# Patient Record
Sex: Female | Born: 1990 | Hispanic: No | Marital: Single | Smoking: Never smoker
Health system: Southern US, Community
[De-identification: ages and names within clinical notes are randomized; demographics above are authoritative.]

## PROBLEM LIST (undated history)

## (undated) DIAGNOSIS — F4310 Post-traumatic stress disorder, unspecified: Secondary | ICD-10-CM

## (undated) DIAGNOSIS — F419 Anxiety disorder, unspecified: Secondary | ICD-10-CM

## (undated) HISTORY — DX: Anxiety disorder, unspecified: F41.9

## (undated) HISTORY — DX: Post-traumatic stress disorder, unspecified: F43.10

---

## 2019-09-21 ENCOUNTER — Ambulatory Visit: Payer: 59 | Admitting: Critical Care Medicine

## 2019-09-21 ENCOUNTER — Other Ambulatory Visit: Payer: Self-pay

## 2019-09-21 VITALS — BP 118/70 | HR 81 | Temp 98.7°F | Resp 18 | Ht 64.0 in | Wt 104.0 lb

## 2019-09-21 DIAGNOSIS — F4310 Post-traumatic stress disorder, unspecified: Secondary | ICD-10-CM | POA: Diagnosis not present

## 2019-09-21 DIAGNOSIS — Z114 Encounter for screening for human immunodeficiency virus [HIV]: Secondary | ICD-10-CM | POA: Diagnosis not present

## 2019-09-21 DIAGNOSIS — F411 Generalized anxiety disorder: Secondary | ICD-10-CM | POA: Insufficient documentation

## 2019-09-21 DIAGNOSIS — E538 Deficiency of other specified B group vitamins: Secondary | ICD-10-CM | POA: Diagnosis not present

## 2019-09-21 NOTE — Assessment & Plan Note (Signed)
Severe anxiety state law declined by the posttraumatic stress disorder as per above assessment plan will be looking for an ambulatory referral to psychology  We will hold off on medications at this time

## 2019-09-21 NOTE — Assessment & Plan Note (Signed)
History of vitamin B-12 deficiency currently currently on supplementation  We will obtain a vitamin B12 level at this time

## 2019-09-21 NOTE — Progress Notes (Signed)
Subjective:    Patient ID: Mallory Arnold, female    DOB: 09/04/1990, 29 y.o.   MRN: [id number]  This is a pleasant 29 year old female who comes to the mobile medicine unit after having arrived from [place] to [place] to Dr. Have months ago.  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  This patient has a history of posttraumatic stress disorder symptom complex and that she saw did bodies on the street where she grew up in [place] in the fourth grade and she now has recurring thoughts that are precipitated when she has a fear of death if she loses a loved one or sees death on television or other media.  This creates increased palpitations chest pain nervousness anxiety she also has crying spells as well.  Her mood [place] is exacerbated the symptoms and that she is trying to adjust to her new environment.  She is yet to achieve a primary care physician is looking for this as well.  She did have a Pap smear a year ago which is another one this year.  From a symptom complex perspective she has no other specific symptoms.  She does take B12 supplementation using a sublingual 5000 mcg administration daily for a month and she will go months without them cycle back on again.  She has been on this medication recently.  She is also on no other medications at this time.  She is due a tetanus vaccine but wishes to hold off on that at this time but is willing to have lab studies done   Past Medical History:  Diagnosis Date  . Anxiety   . PTSD (post-traumatic stress disorder)      Family History  Problem Relation Age of Onset  . Diabetes Mother   . Hypertension Father   . Vision loss Father      Social History   Socioeconomic History  . Marital status: Single    Spouse name: Not on file  . Number of children: Not on file  . Years of education: Not on file  . Highest education level: Not on file  Occupational History  . Not on file  Tobacco Use  . Smoking  status: Never Smoker  . Smokeless tobacco: Never Used  Substance and Sexual Activity  . Alcohol use: Not on file  . Drug use: Not on file  . Sexual activity: Not Currently  Other Topics Concern  . Not on file  Social History Narrative  . Not on file   Social Determinants of Health   Financial Resource Strain:   . Difficulty of Paying Living Expenses:   Food Insecurity:   . Worried About Charity fundraiser in the Last Year:   . Arboriculturist in the Last Year:   Transportation Needs:   . Film/video editor (Medical):   Marland Kitchen Lack of Transportation (Non-Medical):   Physical Activity:   . Days of Exercise per Week:   . Minutes of Exercise per Session:   Stress:   . Feeling of Stress :   Social Connections:   . Frequency of Communication with Friends and Family:   . Frequency of Social Gatherings with Friends and Family:   . Attends Religious Services:   . Active Member of Clubs or Organizations:   . Attends Archivist Meetings:   Marland Kitchen Marital Status:   Intimate Partner Violence:   . Fear of Current or Ex-Partner:   . Emotionally Abused:   .  Physically Abused:   . Sexually Abused:      Allergies  Allergen Reactions  . Beef-Derived Products Hives and Swelling    2008  . Cinnamon Hives    2006     Outpatient Medications Prior to Visit  Medication Sig Dispense Refill  . Cyanocobalamin (VITAMIN B-12) 5000 MCG SUBL Place under the tongue. Take daily for one month, then wait a month and resume for a one month course     No facility-administered medications prior to visit.     Review of Systems  Constitutional: Negative for activity change, appetite change and fatigue.  HENT: Negative.   Eyes: Negative.   Respiratory: Negative.   Cardiovascular: Positive for palpitations. Negative for chest pain and leg swelling.  Gastrointestinal: Negative for abdominal distention, abdominal pain, anal bleeding, blood in stool, constipation, diarrhea, nausea, rectal pain  and vomiting.  Endocrine: Negative.   Genitourinary: Negative.  Negative for vaginal bleeding, vaginal discharge and vaginal pain.  Musculoskeletal: Negative.   Skin: Negative.   Neurological: Positive for dizziness, tremors and headaches. Negative for seizures, syncope and weakness.  Psychiatric/Behavioral: Positive for dysphoric mood and sleep disturbance. Negative for confusion, decreased concentration, hallucinations, self-injury and suicidal ideas. The patient is nervous/anxious and is hyperactive.        Objective:   Physical Exam Vitals:   09/21/19 1409  BP: 118/70  Pulse: 81  Resp: 18  Temp: 98.7 F (37.1 C)  TempSrc: Oral  SpO2: 100%  Height: 5\' 4"  (1.626 m)    Gen: Pleasant, well-nourished, in no distress,  normal affect  ENT: No lesions,  mouth clear,  oropharynx clear, no postnasal drip  Neck: No JVD, no TMG, no carotid bruits  Lungs: No use of accessory muscles, no dullness to percussion, clear without rales or rhonchi  Cardiovascular: RRR, heart sounds normal, no murmur or gallops, no peripheral edema  Abdomen: soft and NT, no HSM,  BS normal  Musculoskeletal: No deformities, no cyanosis or clubbing  Neuro: alert, non focal  Skin: Warm, no lesions or rashes       Assessment & Plan:  I personally reviewed all images and lab data in the [facility] system as well as any outside material available during this office visit and agree with the  radiology impressions.   PTSD (post-traumatic stress disorder) Evidence for post traumatic strep disorder based on adverse childhood experience of witnessing of [place] fourth [employer].  This patient now is experiencing episodes of missing anxiety stress palpitations chest pain and mood swings  Her symptoms do not appear to be severe enough to warrant medication therapy but she definitely would benefit from psychotherapy or if he psychological  counseling  We will endeavor to get her into the [facility] soon as possible as she does have [employer] attorney health and healthcare insurance  Anxiety state Severe anxiety state law declined by the posttraumatic stress disorder as per above assessment plan will be looking for an ambulatory referral to psychology  We will hold off on medications at this time  Vitamin B 12 deficiency History of vitamin B-12 deficiency currently currently on supplementation  We will obtain a vitamin B12 level at this time   [place] was seen today for annual exam.  Diagnoses and all orders for this visit:  PTSD (post-traumatic stress disorder) -     Ambulatory referral to Psychology  Anxiety state -  Comprehensive metabolic panel -     Ambulatory referral to Psychology -     CBC with Differential/Platelet  Vitamin B 12 deficiency -     Vitamin B12  Encounter for screening for HIV -     HIV antibody (with reflex)   In addition to B12 level we will check a complete metabolic panel complete blood count and HIV study at this visit

## 2019-09-21 NOTE — Patient Instructions (Signed)
Referral to behavioral health psychology sessions regarding your PTSD will be made  A primary care referral for Dr. Phill Myron at primary care [facility] will be made  Labs today include metabolic panel complete blood count HIV and B12 level  Please review below some tips on your anxiety tips   Generalized Anxiety Disorder, Adult Generalized anxiety disorder (GAD) is a mental health disorder. People with this condition constantly worry about everyday events. Unlike normal anxiety, worry related to GAD is not triggered by a specific event. These worries also do not fade or get better with time. GAD interferes with life functions, including relationships, work, and school. GAD can vary from mild to severe. People with severe GAD can have intense waves of anxiety with physical symptoms (panic attacks). What are the causes? The exact cause of GAD is not known. What increases the risk? This condition is more likely to develop in:  Women.  People who have a family history of anxiety disorders.  People who are very shy.  People who experience very stressful life events, such as the death of a loved one.  People who have a very stressful family environment. What are the signs or symptoms? People with GAD often worry excessively about many things in their lives, such as their health and family. They may also be overly concerned about:  Doing well at work.  Being on time.  Natural disasters.  Friendships. Physical symptoms of GAD include:  Fatigue.  Muscle tension or having muscle twitches.  Trembling or feeling shaky.  Being easily startled.  Feeling like your heart is pounding or racing.  Feeling out of breath or like you cannot take a deep breath.  Having trouble falling asleep or staying asleep.  Sweating.  Nausea, diarrhea, or irritable bowel syndrome (IBS).  Headaches.  Trouble concentrating or remembering facts.  Restlessness.  Irritability. How is this  diagnosed? Your health care provider can diagnose GAD based on your symptoms and medical history. You will also have a physical exam. The health care provider will ask specific questions about your symptoms, including how severe they are, when they started, and if they come and go. Your health care provider may ask you about your use of alcohol or drugs, including prescription medicines. Your health care provider may refer you to a mental health specialist for further evaluation. Your health care provider will do a thorough examination and may perform additional tests to rule out other possible causes of your symptoms. To be diagnosed with GAD, a person must have anxiety that:  Is out of his or her control.  Affects several different aspects of his or her life, such as work and relationships.  Causes distress that makes him or her unable to take part in normal activities.  Includes at least three physical symptoms of GAD, such as restlessness, fatigue, trouble concentrating, irritability, muscle tension, or sleep problems. Before your health care provider can confirm a diagnosis of GAD, these symptoms must be present more days than they are not, and they must last for six months or longer. How is this treated? The following therapies are usually used to treat GAD:  Medicine. Antidepressant medicine is usually prescribed for long-term daily control. Antianxiety medicines may be added in severe cases, especially when panic attacks occur.  Talk therapy (psychotherapy). Certain types of talk therapy can be helpful in treating GAD by providing support, education, and guidance. Options include: ? Cognitive behavioral therapy (CBT). People learn coping skills and techniques to ease their  anxiety. They learn to identify unrealistic or negative thoughts and behaviors and to replace them with positive ones. ? Acceptance and commitment therapy (ACT). This treatment teaches people how to be mindful as a way  to cope with unwanted thoughts and feelings. ? Biofeedback. This process trains you to manage your body's response (physiological response) through breathing techniques and relaxation methods. You will work with a therapist while machines are used to monitor your physical symptoms.  Stress management techniques. These include yoga, meditation, and exercise. A mental health specialist can help determine which treatment is best for you. Some people see improvement with one type of therapy. However, other people require a combination of therapies. Follow these instructions at home:  Take over-the-counter and prescription medicines only as told by your health care provider.  Try to maintain a normal routine.  Try to anticipate stressful situations and allow extra time to manage them.  Practice any stress management or self-calming techniques as taught by your health care provider.  Do not punish yourself for setbacks or for not making progress.  Try to recognize your accomplishments, even if they are small.  Keep all follow-up visits as told by your health care provider. This is important. Contact a health care provider if:  Your symptoms do not get better.  Your symptoms get worse.  You have signs of depression, such as: ? A persistently sad, cranky, or irritable mood. ? Loss of enjoyment in activities that used to bring you joy. ? Change in weight or eating. ? Changes in sleeping habits. ? Avoiding friends or family members. ? Loss of energy for normal tasks. ? Feelings of guilt or worthlessness. Get help right away if:  You have serious thoughts about hurting yourself or others. If you ever feel like you may hurt yourself or others, or have thoughts about taking your own life, get help right away. You can go to your nearest emergency department or call:  Your local emergency services (911 in the [place]  A suicide crisis helpline, such as the National Suicide Prevention  Lifeline at 562-836-0690. This is open 24 hours a day. Summary  Generalized anxiety disorder (GAD) is a mental health disorder that involves worry that is not triggered by a specific event.  People with GAD often worry excessively about many things in their lives, such as their health and family.  GAD may cause physical symptoms such as restlessness, trouble concentrating, sleep problems, frequent sweating, nausea, diarrhea, headaches, and trembling or muscle twitching.  A mental health specialist can help determine which treatment is best for you. Some people see improvement with one type of therapy. However, other people require a combination of therapies. This information is not intended to replace advice given to you by your health care provider. Make sure you discuss any questions you have with your health care provider. Document Revised: 05/26/2017 Document Reviewed: 05/03/2016 Elsevier Patient Education  2020 [employer].

## 2019-09-21 NOTE — Assessment & Plan Note (Signed)
Evidence for post traumatic strep disorder based on adverse childhood experience of witnessing of [place] fourth [employer].  This patient now is experiencing episodes of missing anxiety stress palpitations chest pain and mood swings  Her symptoms do not appear to be severe enough to warrant medication therapy but she definitely would benefit from psychotherapy or if he psychological counseling  We will endeavor to get her into the [facility] soon as possible as she does have [place] health and healthcare insurance

## 2019-09-25 LAB — CBC WITH DIFFERENTIAL/PLATELET
Basophils Absolute: 0 10*3/uL (ref 0.0–0.2)
Basos: 1 %
EOS (ABSOLUTE): 0.1 10*3/uL (ref 0.0–0.4)
Eos: 2 %
Hematocrit: 39.7 % (ref 34.0–46.6)
Hemoglobin: 12.9 g/dL (ref 11.1–15.9)
Immature Grans (Abs): 0 10*3/uL (ref 0.0–0.1)
Immature Granulocytes: 0 %
Lymphocytes Absolute: 2.4 10*3/uL (ref 0.7–3.1)
Lymphs: 41 %
MCH: 28.2 pg (ref 26.6–33.0)
MCHC: 32.5 g/dL (ref 31.5–35.7)
MCV: 87 fL (ref 79–97)
Monocytes Absolute: 0.4 10*3/uL (ref 0.1–0.9)
Monocytes: 7 %
Neutrophils Absolute: 2.9 10*3/uL (ref 1.4–7.0)
Neutrophils: 49 %
Platelets: 317 10*3/uL (ref 150–450)
RBC: 4.57 x10E6/uL (ref 3.77–5.28)
RDW: 12.3 % (ref 11.7–15.4)
WBC: 5.9 10*3/uL (ref 3.4–10.8)

## 2019-09-25 LAB — SPECIMEN STATUS REPORT

## 2019-09-26 ENCOUNTER — Telehealth: Payer: Self-pay | Admitting: *Deleted

## 2019-09-26 NOTE — Telephone Encounter (Signed)
-----   Message from Storm Frisk, MD sent at 09/26/2019 10:03 AM EDT ----- Can you call ms Brodowski , one of our Saturday pts, let her know CBC is normal and I am still waiting on rest of her ordered labs, can you check with [facility] on those

## 2019-09-26 NOTE — Telephone Encounter (Signed)
Patient verified DOB Patient is are of CBC being normal and awaiting the other results.

## 2019-10-01 ENCOUNTER — Telehealth: Payer: Self-pay

## 2019-10-01 NOTE — Telephone Encounter (Signed)
Called patient to do their pre-visit COVID screening.  Call went to voicemail, which hasn't been set up. Unable to do prescreening. 

## 2019-10-01 NOTE — Patient Instructions (Addendum)
Thank you for choosing Primary Care at [facility] to be your medical home!    Lindey Renzulli was seen by De Hollingshead, DO today.   Delle Reining Bowmer's primary care provider is Marcy Siren, DO.   For the best care possible, you should try to see Marcy Siren, DO whenever you come to the clinic.   We look forward to seeing you again soon!  If you have any questions about your visit today, please call us at [phone] or feel free to reach your primary care provider via MyChart.

## 2019-10-02 ENCOUNTER — Ambulatory Visit (INDEPENDENT_AMBULATORY_CARE_PROVIDER_SITE_OTHER): Payer: Self-pay | Admitting: Internal Medicine

## 2019-10-02 ENCOUNTER — Encounter: Payer: Self-pay | Admitting: Internal Medicine

## 2019-10-02 ENCOUNTER — Other Ambulatory Visit: Payer: Self-pay

## 2019-10-02 DIAGNOSIS — Z7689 Persons encountering health services in other specified circumstances: Secondary | ICD-10-CM

## 2019-10-02 NOTE — Progress Notes (Signed)
  Subjective:    Mallory Arnold - 29 y.o. female MRN [id number]  Date of birth: [birth year]  HPI  Mallory Arnold is to establish care. Patient has a PMH significant for PTSD, anxiety, and Vit B12 disorder. She was seen on mobile unit on 3/27 for these concerns. She presents today with no concerns. Thought she was due for a PAP but reports 1 year ago she had a normal PAP in [place]. Her labs are appearing as not resulted on my end but patient reports she was told of results and instructed to decrease her B12 dose because her level was slightly high. Wants to check on status of her psych referral. Given no new management or concerns today as well as no exam or procedures, will submit as no charge.

## 2019-10-03 LAB — VITAMIN B12: Vitamin B-12: >2000 pg/mL — ABNORMAL HIGH (ref 232–1245)

## 2019-10-03 LAB — COMPREHENSIVE METABOLIC PANEL
ALT: 8 IU/L (ref 0–32)
AST: 17 IU/L (ref 0–40)
Albumin/Globulin Ratio: 1.5 (ref 1.2–2.2)
Albumin: 4.7 g/dL (ref 3.9–5.0)
Alkaline Phosphatase: 65 IU/L (ref 39–117)
BUN/Creatinine Ratio: 13 (ref 9–23)
BUN: 9 mg/dL (ref 6–20)
Bilirubin Total: 1 mg/dL (ref 0.0–1.2)
CO2: 18 mmol/L — ABNORMAL LOW (ref 20–29)
Calcium: 9.5 mg/dL (ref 8.7–10.2)
Chloride: 101 mmol/L (ref 96–106)
Creatinine, Ser: 0.68 mg/dL (ref 0.57–1.00)
GFR calc Af Amer: 139 mL/min/{1.73_m2} (ref >59)
GFR calc non Af Amer: 120 mL/min/{1.73_m2} (ref >59)
Globulin, Total: 3.2 g/dL (ref 1.5–4.5)
Glucose: 79 mg/dL (ref 65–99)
Potassium: 3.8 mmol/L (ref 3.5–5.2)
Sodium: 137 mmol/L (ref 134–144)
Total Protein: 7.9 g/dL (ref 6.0–8.5)

## 2019-10-03 LAB — HIV ANTIBODY (ROUTINE TESTING W REFLEX): HIV Screen 4th Generation wRfx: NONREACTIVE

## 2019-10-05 ENCOUNTER — Ambulatory Visit: Payer: Self-pay

## 2019-10-08 ENCOUNTER — Ambulatory Visit: Payer: 59 | Attending: Internal Medicine

## 2019-10-08 DIAGNOSIS — Z23 Encounter for immunization: Secondary | ICD-10-CM

## 2019-10-08 NOTE — Progress Notes (Signed)
   Covid-19 Vaccination Clinic  Name:  Adisson Deak    MRN: [id number] DOB: Nov 19, 1990  10/08/2019  Ms. Boehning was observed post Covid-19 immunization for 15 minutes without incident. She was provided with Vaccine Information Sheet and instruction to access the V-Safe system.   Ms. Stave was instructed to call 911 with any severe reactions post vaccine: Marland Kitchen Difficulty breathing  . Swelling of face and throat  . A fast heartbeat  . A bad rash all over body  . Dizziness and weakness   Immunizations Administered    Name Date Dose VIS Date Route   Pfizer COVID-19 Vaccine 10/08/2019  3:55 PM 0.3 mL 06/07/2019 Intramuscular   Manufacturer: [employer], [employer]   Lot: [id number]   NDC: [address]-8

## 2019-10-15 ENCOUNTER — Ambulatory Visit: Payer: Self-pay

## 2019-10-29 ENCOUNTER — Ambulatory Visit: Payer: 59 | Attending: Internal Medicine

## 2019-10-29 DIAGNOSIS — Z23 Encounter for immunization: Secondary | ICD-10-CM

## 2019-10-29 NOTE — Progress Notes (Signed)
   Covid-19 Vaccination Clinic  Name:  Mallory Arnold    MRN: [id number] DOB: 1991-02-02  10/29/2019  Mallory Arnold was observed post Covid-19 immunization for 30 minutes based on pre-vaccination screening without incident. She was provided with Vaccine Information Sheet and instruction to access the V-Safe system.   Mallory Arnold was instructed to call 911 with any severe reactions post vaccine: Marland Kitchen Difficulty breathing  . Swelling of face and throat  . A fast heartbeat  . A bad rash all over body  . Dizziness and weakness   Immunizations Administered    Name Date Dose VIS Date Route   Pfizer COVID-19 Vaccine 10/29/2019  4:26 PM 0.3 mL 08/21/2018 Intramuscular   Manufacturer: [employer], [employer]   Lot: [id number]   NDC: [address]-2

## 2020-05-27 ENCOUNTER — Other Ambulatory Visit: Payer: Self-pay

## 2020-05-27 ENCOUNTER — Ambulatory Visit (INDEPENDENT_AMBULATORY_CARE_PROVIDER_SITE_OTHER): Payer: 59 | Admitting: Physician Assistant

## 2020-05-27 ENCOUNTER — Ambulatory Visit (INDEPENDENT_AMBULATORY_CARE_PROVIDER_SITE_OTHER): Payer: 59

## 2020-05-27 VITALS — BP 95/59 | HR 82 | Temp 97.2°F | Resp 17 | Wt 103.0 lb

## 2020-05-27 DIAGNOSIS — M545 Low back pain, unspecified: Secondary | ICD-10-CM

## 2020-05-27 MED ORDER — CYCLOBENZAPRINE HCL 5 MG PO TABS: 5.0000 mg | ORAL_TABLET | Freq: Every day | ORAL | 1 refills | Status: DC

## 2020-05-27 MED ORDER — IBUPROFEN 600 MG PO TABS: 600.0000 mg | ORAL_TABLET | Freq: Three times a day (TID) | ORAL | 0 refills | Status: AC | PRN

## 2020-05-27 NOTE — Progress Notes (Signed)
Patient ID: Mallory Arnold, female   DOB: October 16, 1990, 29 y.o.   MRN: [id number]     Mallory Arnold, is a 29 y.o. female  LYY:[id number]  LEX:[id number]  DOB - 04-30-1991  Subjective:  Chief Complaint and HPI: Mallory Arnold is a 29 y.o. female here today with on and off RLB pain for about 3 weeks.  NKI but she does remember picking up an empty fish tank a couple of days prior to it starting to hurt.  She is not sexually active.  LMP November 15.  No urinary s/sx.  Her L leg felt num "on the inside" from top to bottom for about 15 mins 1 time since her back started hurting.  She says it felt as though the bone in her legs were numb.  This has not recurred.  She has not taken any meds for the discomfort.  No weakness.  No other paresthesias.  No exacerbating or allevitaing factors.  Did not hurt at all yesterday.  The discomfort is mild today.    ROS:   Constitutional:  No f/c, No night sweats, No unexplained weight loss. EENT:  No vision changes, No blurry vision, No hearing changes. No mouth, throat, or ear problems.  Respiratory: No cough, No SOB Cardiac: No CP, no palpitations GI:  No abd pain, No N/V/D. GU: No Urinary s/sx Musculoskeletal: see above Neuro: No headache, no dizziness, no motor weakness.  Skin: No rash Endocrine:  No polydipsia. No polyuria.  Psych: Denies SI/HI  No problems updated.  ALLERGIES: Allergies  Allergen Reactions  . Beef-Derived Products Hives and Swelling    2008  . Cinnamon Hives    2006    PAST MEDICAL HISTORY: Past Medical History:  Diagnosis Date  . Anxiety   . PTSD (post-traumatic stress disorder)     MEDICATIONS AT HOME: Prior to Admission medications   Medication Sig Start Date End Date Taking? Authorizing Provider  Ascorbic Acid (VITAMIN C) 1000 MG tablet Take 1,000 mg by mouth daily.   Yes [provider]  ferrous sulfate 325 (65 FE) MG tablet Take 325 mg by mouth daily with breakfast.   Yes [provider]  YAZ  3-0.02 MG tablet Take 1 tablet by mouth daily. 05/26/20  Yes [provider]  cyclobenzaprine (FLEXERIL) 5 MG tablet Take 1 tablet (5 mg total) by mouth at bedtime. Prn muscle spasm 05/27/20   Anders Simmonds, PA-C  ibuprofen (ADVIL) 600 MG tablet Take 1 tablet (600 mg total) by mouth every 8 (eight) hours as needed. Prn pain 05/27/20   Anders Simmonds, PA-C     Objective:  EXAM:   Vitals:   05/27/20 1449  BP: (!) 95/59  Pulse: 82  Resp: 17  Temp: (!) 97.2 F (36.2 C)  TempSrc: Temporal  SpO2: 99%  Weight: 103 lb (46.7 kg)    General appearance : A&OX3. NAD. Non-toxic-appearing HEENT: Atraumatic and Normocephalic.  PERRLA. Neck: supple, no JVD. No cervical lymphadenopathy. No thyromegaly Chest/Lungs:  Breathing-non-labored, Good air entry bilaterally, breath sounds normal without rales, rhonchi, or wheezing  CVS: S1 S2 regular, no murmurs, gallops, rubs  Spine-no TTP of spiny processes. there is mild thoracolumbar scoliosis and mild spasms in R lower back.  No CVA TTP.  Neg SLR B.  BLE DTR=intact B.   Extremities: Bilateral Lower Ext shows no edema, both legs are warm to touch with = pulse throughout Neurology:  CN II-XII grossly intact, Non focal.   Psych:  TP linear. J/I WNL.  Normal speech. Appropriate eye contact and affect.  Skin:  No Rash  Data Review No results found for: HGBA1C   Assessment & Plan   1. Acute right-sided low back pain without sciatica No red flags - DG Lumbar Spine Complete; Future - ibuprofen (ADVIL) 600 MG tablet; Take 1 tablet (600 mg total) by mouth every 8 (eight) hours as needed. Prn pain  Dispense: 60 tablet; Refill: 0 - cyclobenzaprine (FLEXERIL) 5 MG tablet; Take 1 tablet (5 mg total) by mouth at bedtime. Prn muscle spasm  Dispense: 30 tablet; Refill: 1   Patient have been counseled extensively about nutrition and exercise  Return if symptoms worsen or fail to improve.  The patient was given clear instructions to go to ER or  return to medical center if symptoms don't improve, worsen or new problems develop. The patient verbalized understanding. The patient was told to call to get lab results if they haven't heard anything in the next week.     Georgian Co, PA-C [facility] [place], [place] [phone]   05/27/2020, 3:07 PM

## 2020-05-27 NOTE — Patient Instructions (Signed)
Lumbosacral Strain Lumbosacral strain is an injury that causes pain in the lower back (lumbosacral spine). This injury usually happens from overstretching the muscles or ligaments along your spine. Ligaments are cord-like tissues that connect bones to other bones. A strain can affect one or more muscles or ligaments. What are the causes? This condition may be caused by:  A hard, direct hit to the back.  Overstretching the lower back muscles. This may result from: ? A fall. ? Lifting something heavy. ? Repetitive movements such as bending or crouching. What increases the risk? The following factors may make you more likely to develop this condition:  Participating in sports or activities that involve: ? A sudden twist of the back. ? Pushing or pulling motions.  Being overweight or obese.  Having poor strength and flexibility, especially tight hamstrings or weak muscles in the back or abdomen.  Having too much of a curve in the lower back.  Having a pelvis that is tilted forward. What are the signs or symptoms? The main symptom of this condition is pain in the lower back, at the site of the strain. Pain may also be felt down one or both legs. How is this diagnosed? This condition is diagnosed based on your symptoms, your medical history, and a physical exam. During the physical exam, your health care provider may push on certain areas of your back to find the source of your pain. You may be asked to bend forward, backward, and side to side to check your pain and range of motion. You may also have imaging tests, such as X-rays and an MRI. How is this treated? This condition may be treated by:  Applying heat and cold on the affected area.  Taking medicines to help relieve pain and relax your muscles.  Taking NSAIDs, such as ibuprofen, to help reduce swelling and discomfort.  Doing stretching and strengthening exercises for your lower back. Symptoms usually improve within several  weeks of treatment. However, recovery time varies. When your symptoms improve, gradually return to your normal routine as soon as possible to reduce pain, avoid stiffness, and keep muscle strength. Follow these instructions at home: Medicines  Take over-the-counter and prescription medicines only as told by your health care provider.  Ask your health care provider if the medicine prescribed to you: ? Requires you to avoid driving or using heavy machinery. ? Can cause constipation. You may need to take these actions to prevent or treat constipation:  Drink enough fluid to keep your urine pale yellow.  Take over-the-counter or prescription medicines.  Eat foods that are high in fiber, such as beans, whole grains, and fresh fruits and vegetables.  Limit foods that are high in fat and processed sugars, such as fried or sweet foods. Managing pain, stiffness, and swelling      If directed, put ice on the injured area. To do this: ? Put ice in a plastic bag. ? Place a towel between your skin and the bag. ? Leave the ice on for 20 minutes, 2-3 times a day.  If directed, apply heat on the affected area as often as told by your health care provider. Use the heat source that your health care provider recommends, such as a moist heat pack or a heating pad. ? Place a towel between your skin and the heat source. ? Leave the heat on for 20-30 minutes. ? Remove the heat if your skin turns bright red. This is especially important if you are unable to   feel pain, heat, or cold. You may have a greater risk of getting burned. Activity  Rest as told by your health care provider.  Do not stay in bed. Staying in bed for more than 1-2 days can delay your recovery.  Return to your normal activities as told by your health care provider. Ask your health care provider what activities are safe for you.  Avoid activities that take a lot of energy for as long as told by your health care provider.  Do  exercises as told by your health care provider. This includes stretching and strengthening exercises. General instructions  Sit up and stand up straight. Avoid leaning forward when you sit, or hunching over when you stand.  Do not use any products that contain nicotine or tobacco, such as cigarettes, e-cigarettes, and chewing tobacco. If you need help quitting, ask your health care provider.  Keep all follow-up visits as told by your health care provider. This is important. How is this prevented?   Use correct form when playing sports and lifting heavy objects.  Use good posture when sitting and standing.  Maintain a healthy weight.  Sleep on a mattress with medium firmness to support your back.  Do at least 150 minutes of moderate-intensity exercise each week, such as brisk walking or water aerobics. Try a form of exercise that takes stress off your back, such as swimming or stationary cycling.  Maintain physical fitness, including: ? Strength. ? Flexibility. Contact a health care provider if:  Your back pain does not improve after several weeks of treatment.  Your symptoms get worse. Get help right away if:  Your back pain is severe.  You cannot stand or walk.  You have difficulty controlling when you urinate or when you have a bowel movement.  You feel nauseous or you vomit.  Your feet or legs get very cold, turn pale, or look blue.  You have numbness, tingling, weakness, or problems using your arms or legs.  You develop any of the following: ? Shortness of breath. ? Dizziness. ? Pain in your legs. ? Weakness in your buttocks or legs. Summary  Lumbosacral strain is an injury that causes pain in the lower back (lumbosacral spine).  This injury usually happens from overstretching the muscles or ligaments along your spine.  This condition may be caused by a direct hit to the lower back or by overstretching the lower back muscles.  Symptoms usually improve  within several weeks of treatment. This information is not intended to replace advice given to you by your health care provider. Make sure you discuss any questions you have with your health care provider. Document Revised: 11/06/2018 Document Reviewed: 11/06/2018 Elsevier Patient Education  2020 [employer]  

## 2020-06-06 ENCOUNTER — Other Ambulatory Visit: Payer: 59

## 2020-07-27 ENCOUNTER — Telehealth: Payer: 59 | Admitting: Physician Assistant

## 2020-07-27 ENCOUNTER — Telehealth: Payer: Self-pay

## 2020-07-27 ENCOUNTER — Emergency Department (HOSPITAL_COMMUNITY): Payer: 59

## 2020-07-27 ENCOUNTER — Emergency Department (HOSPITAL_COMMUNITY)
Admission: EM | Admit: 2020-07-27 | Discharge: 2020-07-27 | Disposition: A | Discharge status: Home / Self Care | Payer: 59 | Attending: Emergency Medicine | Admitting: Emergency Medicine

## 2020-07-27 ENCOUNTER — Other Ambulatory Visit: Payer: Self-pay

## 2020-07-27 DIAGNOSIS — R55 Syncope and collapse: Secondary | ICD-10-CM | POA: Insufficient documentation

## 2020-07-27 DIAGNOSIS — R0602 Shortness of breath: Secondary | ICD-10-CM | POA: Diagnosis not present

## 2020-07-27 DIAGNOSIS — R0789 Other chest pain: Secondary | ICD-10-CM | POA: Diagnosis not present

## 2020-07-27 DIAGNOSIS — R519 Headache, unspecified: Secondary | ICD-10-CM | POA: Insufficient documentation

## 2020-07-27 DIAGNOSIS — R079 Chest pain, unspecified: Secondary | ICD-10-CM

## 2020-07-27 DIAGNOSIS — Z20822 Contact with and (suspected) exposure to covid-19: Secondary | ICD-10-CM | POA: Diagnosis not present

## 2020-07-27 LAB — CBC
HCT: 37.4 % (ref 36.0–46.0)
Hemoglobin: 12.3 g/dL (ref 12.0–15.0)
MCH: 28.6 pg (ref 26.0–34.0)
MCHC: 32.9 g/dL (ref 30.0–36.0)
MCV: 87 fL (ref 80.0–100.0)
Platelets: 334 10*3/uL (ref 150–400)
RBC: 4.3 MIL/uL (ref 3.87–5.11)
RDW: 13.1 % (ref 11.5–15.5)
WBC: 10.5 10*3/uL (ref 4.0–10.5)
nRBC: 0 % (ref 0.0–0.2)

## 2020-07-27 LAB — URINALYSIS, ROUTINE W REFLEX MICROSCOPIC
Bilirubin Urine: NEGATIVE
Glucose, UA: NEGATIVE mg/dL
Hgb urine dipstick: NEGATIVE
Ketones, ur: NEGATIVE mg/dL
Leukocytes,Ua: NEGATIVE
Nitrite: NEGATIVE
Protein, ur: NEGATIVE mg/dL
Specific Gravity, Urine: 1.006 (ref 1.005–1.030)
pH: 7 (ref 5.0–8.0)

## 2020-07-27 LAB — RAPID URINE DRUG SCREEN, HOSP PERFORMED
Amphetamines: NOT DETECTED
Barbiturates: NOT DETECTED
Benzodiazepines: NOT DETECTED
Cocaine: NOT DETECTED
Opiates: NOT DETECTED
Tetrahydrocannabinol: NOT DETECTED

## 2020-07-27 LAB — BASIC METABOLIC PANEL
Anion gap: 8 (ref 5–15)
BUN: 9 mg/dL (ref 6–20)
CO2: 26 mmol/L (ref 22–32)
Calcium: 9.5 mg/dL (ref 8.9–10.3)
Chloride: 103 mmol/L (ref 98–111)
Creatinine, Ser: 0.65 mg/dL (ref 0.44–1.00)
GFR, Estimated: >60 mL/min (ref >60)
Glucose, Bld: 86 mg/dL (ref 70–99)
Potassium: 4.6 mmol/L (ref 3.5–5.1)
Sodium: 137 mmol/L (ref 135–145)

## 2020-07-27 LAB — PROTIME-INR
INR: 1.1 (ref 0.8–1.2)
Prothrombin Time: 14 seconds (ref 11.4–15.2)

## 2020-07-27 LAB — I-STAT BETA HCG BLOOD, ED (MC, WL, AP ONLY): I-stat hCG, quantitative: <5 m[IU]/mL (ref <5)

## 2020-07-27 LAB — SARS CORONAVIRUS 2 BY RT PCR (HOSPITAL ORDER, PERFORMED IN ~~LOC~~ HOSPITAL LAB): SARS Coronavirus 2: NEGATIVE

## 2020-07-27 MED ORDER — IOHEXOL 350 MG/ML SOLN
50.0000 mL | Freq: Once | INTRAVENOUS | Status: AC | PRN
  Administered 2020-07-27: 50 mL via INTRAVENOUS

## 2020-07-27 MED ORDER — IBUPROFEN 600 MG PO TABS: 600.0000 mg | ORAL_TABLET | Freq: Four times a day (QID) | ORAL | 0 refills | Status: AC | PRN

## 2020-07-27 NOTE — Telephone Encounter (Signed)
Per chart review pt currently at ED for evaluation

## 2020-07-27 NOTE — ED Provider Notes (Signed)
[facility] EMERGENCY DEPARTMENT Provider Note   CSN: [id number] Arrival date & time: 07/27/20  1031     History Chief Complaint  Patient presents with  . Loss of Consciousness    Mallory Arnold is a 30 y.o. female.  HPI Patient reports has had a pretty bad headache that started about 5 days ago.  She has had a severe pressure behind the forehead that was predominantly toward the right and in the center and back of her head initially.  Briefly seem to improve for a day or so but then came back and again was behind her eyes and into her head generally with a lot of pressure on the top of her head.  Patient denies she had any double vision.  She reports maybe yesterday evening slight blurring of vision.  She reports she has had excessive amount of fatigue for the last week.  She reports her temperature got as high as 99 but she did not get any higher fever.  She has not had any significant amount of coughing but did start to develop some chest pain yesterday.  She reports a central discomfort and feeling slightly short of breath.  The symptoms all seem to get worse today.  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  She was planning to go to urgent care after work and leave a little bit early but the symptoms got much worse and she had an episode of nearly passing out.  She denies she ever had any focal weakness numbness or tingling of her arms or legs.  She did not develop any vomiting.  She reports she did briefly feel like it was really hard to focus and concentrate but does not feel confused at this time.  Patient had Pfizer COVID vaccines in May but has not had booster yet.  She denies any medical problems.  No known family history.  She reports she is youngest of a number of siblings without any known significant medical problems.  Patient does not smoke or drink  alcohol.    Past Medical History:  Diagnosis Date  . Anxiety   . PTSD (post-traumatic stress disorder)     Patient Active Problem List   Diagnosis Date Noted  . PTSD (post-traumatic stress disorder) 09/21/2019  . Anxiety state 09/21/2019  . Vitamin B 12 deficiency 09/21/2019    No past surgical history on file.   OB History   No obstetric history on file.     Family History  Problem Relation Age of Onset  . Diabetes Mother   . Hypertension Father   . Vision loss Father     Social History   Tobacco Use  . Smoking status: Never Smoker  . Smokeless tobacco: Never Used    Home Medications Prior to Admission medications   Medication Sig Start Date End Date Taking? Authorizing Provider  Ascorbic Acid (VITAMIN C) 1000 MG tablet Take 1,000 mg by mouth daily.    [provider]  cyclobenzaprine (FLEXERIL) 5 MG tablet Take 1 tablet (5 mg total) by mouth at bedtime. Prn muscle spasm 05/27/20   Anders Simmonds, PA-C  ferrous sulfate 325 (65 FE) MG tablet Take 325 mg by mouth daily with breakfast.    [provider]  ibuprofen (ADVIL) 600 MG tablet Take 1 tablet (600 mg total) by mouth every 8 (eight) hours as needed. Prn pain  05/27/20   McClung, Marzella Schlein, PA-C  YAZ 3-0.02 MG tablet Take 1 tablet by mouth daily. 05/26/20   [provider]    Allergies    Beef-derived products and Cinnamon  Review of Systems   Review of Systems 10 systems reviewed and negative except as per HPI Physical Exam Updated Vital Signs BP 97/68 (BP Location: Right Arm)   Pulse 90   Temp 99.2 F (37.3 C) (Oral)   Resp 16   Ht 5\' 3"  (1.6 m)   Wt 46.3 kg   SpO2 98%   BMI 18.07 kg/m   Physical Exam Constitutional:      Comments: Patient is alert and nontoxic.  Mental status clear.  No respiratory distress.  HENT:     Head: Normocephalic and atraumatic.     Mouth/Throat:     Pharynx: Oropharynx is clear.  Eyes:     Extraocular Movements: Extraocular movements  intact.  Cardiovascular:     Rate and Rhythm: Normal rate and regular rhythm.  Pulmonary:     Effort: Pulmonary effort is normal.     Breath sounds: Normal breath sounds.  Abdominal:     General: There is no distension.     Palpations: Abdomen is soft.     Tenderness: There is no abdominal tenderness. There is no guarding.  Musculoskeletal:        General: No swelling or tenderness. Normal range of motion.     Cervical back: Neck supple.     Right lower leg: No edema.     Left lower leg: No edema.  Skin:    General: Skin is warm and dry.  Neurological:     General: No focal deficit present.     Mental Status: She is oriented to person, place, and time.     Cranial Nerves: No cranial nerve deficit.     Coordination: Coordination normal.  Psychiatric:        Mood and Affect: Mood normal.     ED Results / Procedures / Treatments   Labs (all labs ordered are listed, but only abnormal results are displayed) Labs Reviewed  URINALYSIS, ROUTINE W REFLEX MICROSCOPIC - Abnormal; Notable for the following components:      Result Value   APPearance HAZY (*)    All other components within normal limits  SARS CORONAVIRUS 2 BY RT PCR (HOSPITAL ORDER, PERFORMED IN [facility])  BASIC METABOLIC PANEL  CBC  PROTIME-INR  RAPID URINE DRUG SCREEN, HOSP PERFORMED  CBG MONITORING, ED  I-STAT BETA HCG BLOOD, ED ([facility], [facility], [facility] ONLY)  I-STAT BETA HCG BLOOD, ED ([facility], [facility], [facility] ONLY)    EKG EKG Interpretation  Date/Time:  Monday July 27 2020 10:34:46 EST Ventricular Rate:  82 PR Interval:  168 QRS Duration: 66 QT Interval:  346 QTC Calculation: 404 R Axis:   77 Text Interpretation: Normal sinus rhythm with sinus arrhythmia Normal ECG No STEMI Confirmed by 12-01-1989 [id number]) on 07/27/2020 3:27:05 PM   Radiology No results found.  Procedures Procedures   Medications Ordered in ED Medications - No data to display  ED Course  I have reviewed the triage vital signs and  the nursing notes.  Pertinent labs & imaging results that were available during my care of the patient were reviewed by me and considered in my medical decision making (see chart for details).    MDM Rules/Calculators/A&P  Patient has had a headache for about 5 days.  Some generalized constitutional symptoms.  Patient's mental status is clear.  No signs of confusion.  She did have a syncopal episode today in conjunction with worsening symptoms.  At this time we will proceed with imaging to rule out aneurysm, dissection or venous sinus thrombosis.  Patient's mental status is clear.  She is not lethargic.  Do not have suspicion of meningitis at this time.  He does not have neck stiffness.  We will also obtain Covid testing and chest x-ray.  Clinically, patient is stable in appearance.  No respiratory distress, normal mental status, movements coordinated purposeful without difficulty.  Dr. Dub Amis to follow-up on diagnostic results for final disposition. Final Clinical Impression(s) / ED Diagnoses Final diagnoses:  Syncope, unspecified syncope type  Bad headache    Rx / DC Orders ED Discharge Orders    None       Arby Barrette, MD 07/27/20 1540

## 2020-07-27 NOTE — ED Notes (Signed)
Patient transported to X-ray 

## 2020-07-27 NOTE — ED Provider Notes (Signed)
Pt signed out to me by Dr. Donnald Garre.  Briefly this is a 30 year old female w/ headache for several days presenting to the ED with syncopal episode while at work today.    Pending covid test and CTA to evaluate for venous thrombosis and arterial abnormalities - EDP dicussed with radiology regarding contrast for venous phase as well.   CTA head and xray chest unremarkable.  Covid negative.  Patient wishing strongly to go home. Advised PCP f/u, explained that our workup in the ED is not exhaustive.  She understands this.  At this time I have a low suspicion for PE or ACS - okay for d/c.   Terald Sleeper, MD 07/28/20 [facility]

## 2020-07-27 NOTE — ED Triage Notes (Signed)
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 Had witnessed syncopal episode by coworkers. Pt describing aura to EMS that occurred prior to syncope, although no hx of seizures.  Ambulatory to ambulance but then had near-syncopal episode. Hx of anxiety, also has misplaced her birth control.

## 2020-07-27 NOTE — Discharge Instructions (Signed)
Your caregiver has diagnosed you as having chest pain that is not specific for one problem, but does not require admission.  You are at low risk for an acute heart condition or other serious illness. Chest pain comes from many different causes.   It is very important that you speak to your provider in 1-2 days for this visit.  Your doctor may want you to be seen in the office for a follow up visit.  SEEK IMMEDIATE MEDICAL ATTENTION IF: You have severe chest pain, especially if the pain is crushing or pressure-like and spreads to the arms, back, neck, or jaw, or if you have sweating, nausea (feeling sick to your stomach), or shortness of breath. THIS IS AN EMERGENCY. Don't wait to see if the pain will go away. Get medical help at once. Call 911 or 0 (operator). DO NOT drive yourself to the hospital.   Your chest pain gets worse and does not go away with rest.  You have an attack of chest pain lasting longer than usual, despite rest and treatment with the medications your caregiver has prescribed.  You wake from sleep with chest pain or shortness of breath.  You feel dizzy or faint.  You have chest pain not typical of your usual pain for which you originally saw your caregiver.  

## 2020-07-28 ENCOUNTER — Telehealth: Payer: Self-pay | Admitting: *Deleted

## 2020-07-28 NOTE — Telephone Encounter (Signed)
Transition Care Management Unsuccessful Follow-up Telephone Call  Date of discharge and from where:  07/27/2020 [facility]  Attempts:  1st Attempt  Reason for unsuccessful TCM follow-up call:  Unable to leave message

## 2020-07-29 NOTE — Telephone Encounter (Signed)
Transition Care Management Unsuccessful Follow-up Telephone Call  Date of discharge and from where:  07/27/2020 [facility]  Attempts:  2nd Attempt  Reason for unsuccessful TCM follow-up call:  Unable to leave message

## 2020-07-30 NOTE — Telephone Encounter (Signed)
Transition Care Management Unsuccessful Follow-up Telephone Call  Date of discharge and from where:  07/27/2020 [facility]  Attempts:  3rd Attempt  Reason for unsuccessful TCM follow-up call:  Unable to leave message

## 2020-07-31 NOTE — Telephone Encounter (Signed)
Pt scheduled for visit w/ PCP and advised to go to UC/ED for any acute needs prior to appt

## 2020-08-04 ENCOUNTER — Encounter: Payer: Self-pay | Admitting: Internal Medicine

## 2020-08-04 ENCOUNTER — Other Ambulatory Visit: Payer: Self-pay

## 2020-08-04 ENCOUNTER — Ambulatory Visit (INDEPENDENT_AMBULATORY_CARE_PROVIDER_SITE_OTHER): Payer: 59 | Admitting: Internal Medicine

## 2020-08-04 ENCOUNTER — Ambulatory Visit: Payer: 59 | Admitting: Internal Medicine

## 2020-08-04 VITALS — BP 93/65 | HR 90 | Temp 98.2°F | Resp 16 | Ht 64.0 in | Wt 106.6 lb

## 2020-08-04 DIAGNOSIS — R5383 Other fatigue: Secondary | ICD-10-CM | POA: Diagnosis not present

## 2020-08-04 DIAGNOSIS — R55 Syncope and collapse: Secondary | ICD-10-CM | POA: Diagnosis not present

## 2020-08-04 NOTE — Progress Notes (Signed)
Subjective:    Mallory Arnold - 30 y.o. female MRN [id number]  Date of birth: [birth year]  HPI  Mallory Arnold is here for ER follow up. Was seen on 1/31 for headache, chest pain, near syncopal event. At visit, patient had negative CXR and CTA head. EKG was NSR with sinus arrhythmia. Lab work was unremarkable. COVID test neg.   Reports that she is still having all the same symptoms, although they seem less severe. Chest pain returned a couple days ago. Located midline over sternum. Not reproducible. Occurs only with deep inspiration or yawning. No palpitations or sensation of heart racing. Near syncopal event x2 since visit. Feels suddenly very tired. Denies weakness of a limb or twitching of limb. Stays conscious the whole time. Reports that in ER did have hand twitching on right side. No known family cardiac history. Has history of anxiety but reports this chest pain is in different location and feels different in nature than when she has panic attacks. Reports drinking plenty of water. No nausea, vomiting, diarrhea. No family history of seizure. No personal history of seizure. No recent cough or cold like symptoms. No heavy lifting.   Still has the same headache, reports moving left to right. Has prior history of headaches but reports nothing this significant. Improves when she takes Advil. Has taken it twice since ED visit.   Health Maintenance:  Health Maintenance Due  Topic Date Due  . Hepatitis C Screening  Never done  . TETANUS/TDAP  Never done  . INFLUENZA VACCINE  Never done  . COVID-19 Vaccine (3 - Booster for Pfizer series) 04/30/2020    -  reports that she has never smoked. She has never used smokeless tobacco. - Review of Systems: Per HPI. - Past Medical History: Patient Active Problem List   Diagnosis Date Noted  . PTSD (post-traumatic stress disorder) 09/21/2019  . Anxiety state 09/21/2019  . Vitamin B 12 deficiency 09/21/2019   - Medications: reviewed and updated    Objective:   Physical Exam BP 93/65 (BP Location: Right Arm, Patient Position: Sitting, Cuff Size: Normal)   Pulse 90   Temp 98.2 F (36.8 C) (Oral)   Resp 16   Ht 5\' 4"  (1.626 m)   Wt 106 lb 9.6 oz (48.4 kg)   SpO2 99%   BMI 18.30 kg/m  Physical Exam Constitutional:      General: She is not in acute distress.    Appearance: She is not diaphoretic.  HENT:     Head: Normocephalic and atraumatic.     Mouth/Throat:     Mouth: Mucous membranes are moist.     Pharynx: Oropharynx is clear. No oropharyngeal exudate.  Eyes:     Extraocular Movements: EOM normal.     Conjunctiva/sclera: Conjunctivae normal.  Cardiovascular:     Rate and Rhythm: Normal rate and regular rhythm.     Heart sounds: Normal heart sounds. No murmur heard.     Comments: No TTP over the chest wall  Pulmonary:     Effort: Pulmonary effort is normal. No respiratory distress.     Breath sounds: Normal breath sounds.  Musculoskeletal:        General: Normal range of motion.  Skin:    General: Skin is warm and dry.  Neurological:     General: No focal deficit present.     Mental Status: She is alert and oriented to person, place, and time.  Psychiatric:        Mood and Affect: Affect  normal.        Judgment: Judgment normal.       Assessment & Plan:   1. Near syncope Differential remains broad. So far, work up has been unremarkable. Will obtain echo given multiple episodes of near syncope. Patient declines referral today for neurology. Would have considered work up for seizures, pseudoseizures, narcolepsy as etiology of her symptoms. If persists would recommend referral. Could also consider Holter monitor although patient does not endorse any sensation of palpitations. BP soft but this seems to be consistent with prior trend. POTs would also be a consideration.  - ECHOCARDIOGRAM COMPLETE; Future  2. Fatigue, unspecified type Patient elected not to have labs ordered today to investigate for other  etiologies of fatigue.  - TSH - Vitamin B12 - VITAMIN D 25 Hydroxy (Vit-D Deficiency, Fractures)   Marcy Siren, D.O. 08/04/2020, 3:25 PM Primary Care at [facility]

## 2020-08-05 ENCOUNTER — Telehealth: Payer: Self-pay

## 2020-08-05 NOTE — Telephone Encounter (Signed)
Prior auth complete for echo, auth #: [id number], 08/05/20-09/19/20, info given to cardiology scheduling, appt made for Tues, 2/15 @ 3:15p, Arrive @ 3pm. Location: [facility], Entrance C, ATC pt to give info, no answer, [facility], VM full, MyChart message sent w/ info.

## 2020-08-11 ENCOUNTER — Ambulatory Visit (HOSPITAL_COMMUNITY): Admission: RE | Admit: 2020-08-11 | Payer: 59 | Source: Ambulatory Visit

## 2020-08-11 ENCOUNTER — Other Ambulatory Visit: Payer: Self-pay

## 2022-09-25 IMAGING — CT CT ANGIO HEAD
2 of 11 series · 8 of 47 positions shown · non-contrast
Comparison: None.

CLINICAL DATA: Vertebral artery dissection suspected.

EXAM:
CT ANGIOGRAPHY HEAD
TECHNIQUE: Multidetector CT imaging of the head was performed using the
standard protocol during bolus administration of intravenous
contrast. Multiplanar CT image reconstructions and MIPs were
obtained to evaluate the vascular anatomy.

[Series 5: head bone · axial · 0.39mm/px · z∈[-160,-72]mm · 4 of 74 slices shown]
[im 15/74  bone]
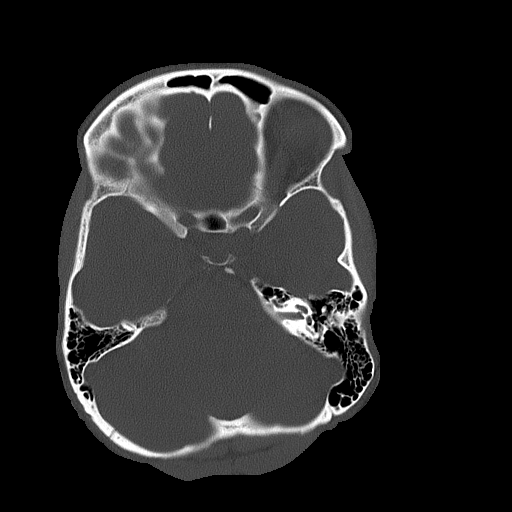
[im 30/74  bone]
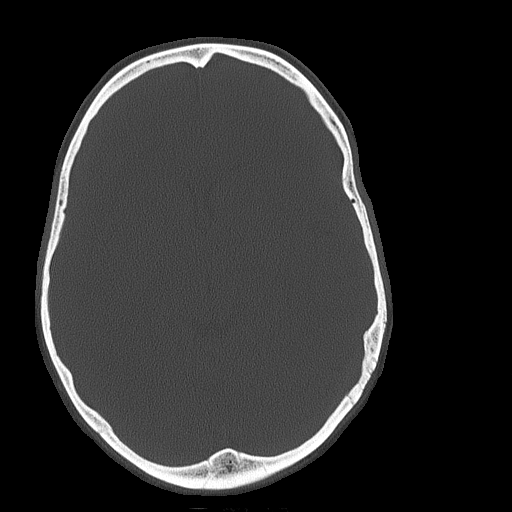
[im 44/74  bone]
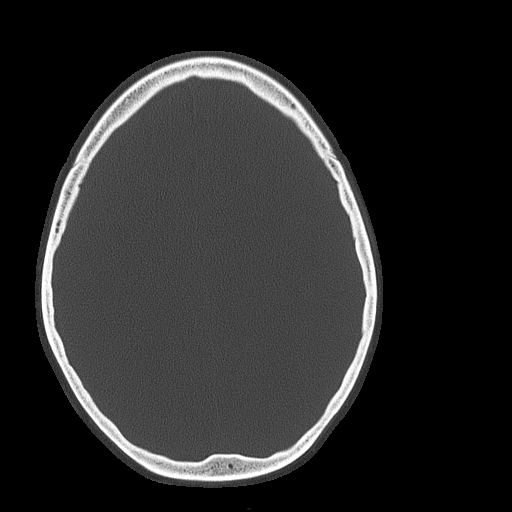
[im 59/74  bone]
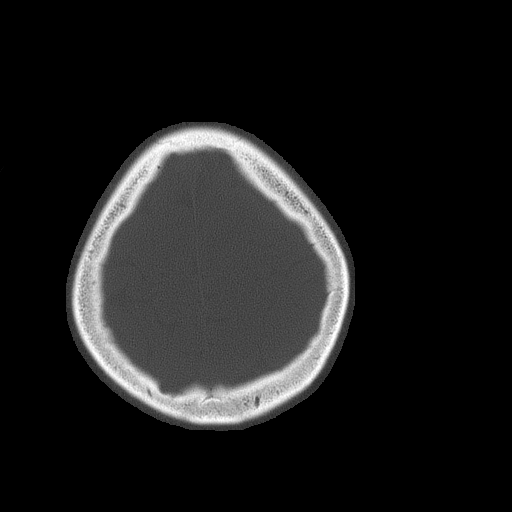

[Series 9: cow 2.0 · axial · 0.39mm/px · z∈[-152,-56]mm · 4 of 81 slices shown]
[im 17/81  brain]
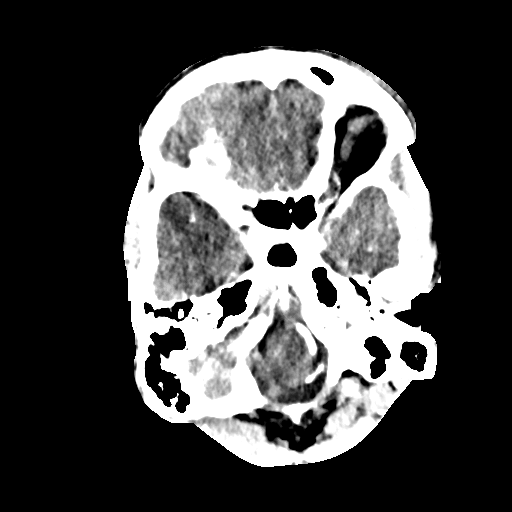
[im 33/81  bone]
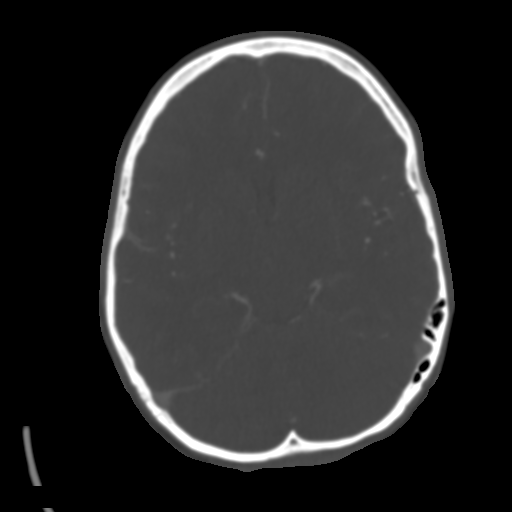
[im 49/81  brain]
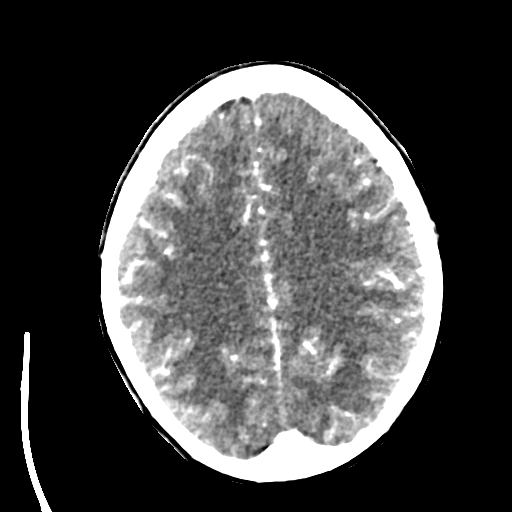
[im 65/81  bone]
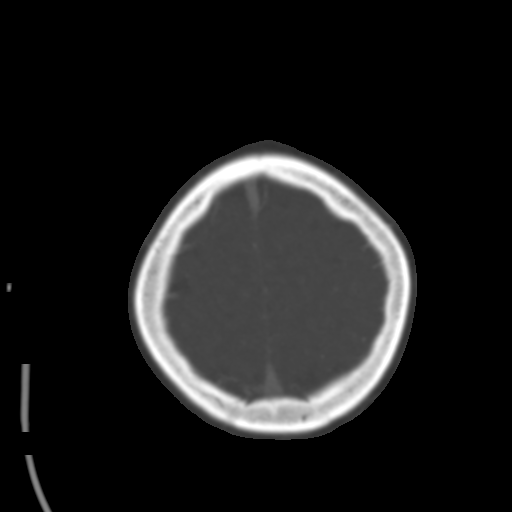

[8 of 47 positions shown; findings below may reference images not displayed]

FINDINGS: CT HEAD

Brain: No evidence of acute infarction, hemorrhage, hydrocephalus,
extra-axial collection or mass lesion/mass effect.

Vascular: No hyperdense vessel or unexpected calcification.

Skull: Normal. Negative for fracture or focal lesion.

Sinuses: Imaged portions are clear.

Orbits: No acute finding.

CTA HEAD

Anterior circulation: No significant stenosis, proximal occlusion,
aneurysm, or vascular malformation.

Posterior circulation: No significant stenosis, proximal occlusion,
aneurysm, or vascular malformation.

Venous sinuses: As permitted by contrast timing, patent.

Anatomic variants: None.
IMPRESSION: Normal CTA of the head.

## 2023-03-17 ENCOUNTER — Ambulatory Visit: Payer: Self-pay

## 2023-03-17 ENCOUNTER — Other Ambulatory Visit: Payer: Self-pay | Admitting: Family

## 2023-03-17 DIAGNOSIS — M25562 Pain in left knee: Secondary | ICD-10-CM
